# Patient Record
Sex: Female | Born: 1995 | Race: Black or African American | Hispanic: No | Marital: Single | Smoking: Former smoker
Health system: Southern US, Community
[De-identification: ages and names within clinical notes are randomized; demographics above are authoritative.]

## PROBLEM LIST (undated history)

## (undated) DIAGNOSIS — J45909 Unspecified asthma, uncomplicated: Secondary | ICD-10-CM

---

## 2014-03-18 ENCOUNTER — Emergency Department (HOSPITAL_COMMUNITY)
Admission: EM | Admit: 2014-03-18 | Discharge: 2014-03-18 | Disposition: A | Discharge status: Home / Self Care | Payer: Medicaid Other | Attending: Emergency Medicine | Admitting: Emergency Medicine

## 2014-03-18 ENCOUNTER — Encounter (HOSPITAL_COMMUNITY): Payer: Self-pay | Admitting: Neurology

## 2014-03-18 DIAGNOSIS — Z720 Tobacco use: Secondary | ICD-10-CM | POA: Diagnosis not present

## 2014-03-18 DIAGNOSIS — R112 Nausea with vomiting, unspecified: Secondary | ICD-10-CM

## 2014-03-18 DIAGNOSIS — Z3202 Encounter for pregnancy test, result negative: Secondary | ICD-10-CM | POA: Insufficient documentation

## 2014-03-18 DIAGNOSIS — R197 Diarrhea, unspecified: Secondary | ICD-10-CM | POA: Insufficient documentation

## 2014-03-18 LAB — POC URINE PREG, ED: Preg Test, Ur: NEGATIVE

## 2014-03-18 LAB — URINALYSIS, ROUTINE W REFLEX MICROSCOPIC
Bilirubin Urine: NEGATIVE
Glucose, UA: NEGATIVE mg/dL
LEUKOCYTES UA: NEGATIVE
NITRITE: NEGATIVE
PROTEIN: 30 mg/dL — AB
SPECIFIC GRAVITY, URINE: 1.035 — AB (ref 1.005–1.030)
UROBILINOGEN UA: 0.2 mg/dL (ref 0.0–1.0)
pH: 6 (ref 5.0–8.0)

## 2014-03-18 LAB — COMPREHENSIVE METABOLIC PANEL
ALBUMIN: 5.1 g/dL (ref 3.5–5.2)
ALK PHOS: 76 U/L (ref 39–117)
ALT: 12 U/L (ref 0–35)
AST: 22 U/L (ref 0–37)
Anion gap: 13 (ref 5–15)
BUN: 13 mg/dL (ref 6–23)
CALCIUM: 10.1 mg/dL (ref 8.4–10.5)
CHLORIDE: 105 meq/L (ref 96–112)
CO2: 19 mmol/L (ref 19–32)
Creatinine, Ser: 0.83 mg/dL (ref 0.50–1.10)
GFR calc Af Amer: >90 mL/min (ref >90)
Glucose, Bld: 143 mg/dL — ABNORMAL HIGH (ref 70–99)
Potassium: 5.1 mmol/L (ref 3.5–5.1)
SODIUM: 137 mmol/L (ref 135–145)
Total Bilirubin: 1.3 mg/dL — ABNORMAL HIGH (ref 0.3–1.2)
Total Protein: 8.6 g/dL — ABNORMAL HIGH (ref 6.0–8.3)

## 2014-03-18 LAB — URINE MICROSCOPIC-ADD ON

## 2014-03-18 LAB — CBC WITH DIFFERENTIAL/PLATELET
BASOS ABS: 0 10*3/uL (ref 0.0–0.1)
BASOS PCT: 0 % (ref 0–1)
EOS PCT: 0 % (ref 0–5)
Eosinophils Absolute: 0 10*3/uL (ref 0.0–0.7)
HCT: 50.7 % — ABNORMAL HIGH (ref 36.0–46.0)
HEMOGLOBIN: 17.5 g/dL — AB (ref 12.0–15.0)
Lymphocytes Relative: 3 % — ABNORMAL LOW (ref 12–46)
Lymphs Abs: 0.6 10*3/uL — ABNORMAL LOW (ref 0.7–4.0)
MCH: 28.5 pg (ref 26.0–34.0)
MCHC: 34.5 g/dL (ref 30.0–36.0)
MCV: 82.4 fL (ref 78.0–100.0)
Monocytes Absolute: 0.6 10*3/uL (ref 0.1–1.0)
Monocytes Relative: 3 % (ref 3–12)
NEUTROS ABS: 19.1 10*3/uL — AB (ref 1.7–7.7)
NEUTROS PCT: 94 % — AB (ref 43–77)
PLATELETS: 348 10*3/uL (ref 150–400)
RBC: 6.15 MIL/uL — AB (ref 3.87–5.11)
RDW: 13 % (ref 11.5–15.5)
WBC: 20.4 10*3/uL — AB (ref 4.0–10.5)

## 2014-03-18 LAB — LIPASE, BLOOD: LIPASE: 28 U/L (ref 11–59)

## 2014-03-18 MED ORDER — ONDANSETRON 8 MG PO TBDP: 8.0000 mg | ORAL_TABLET | Freq: Three times a day (TID) | ORAL | Status: DC | PRN

## 2014-03-18 MED ORDER — KETOROLAC TROMETHAMINE 30 MG/ML IJ SOLN
30.0000 mg | Freq: Once | INTRAMUSCULAR | Status: AC
  Administered 2014-03-18: 30 mg via INTRAVENOUS
  Filled 2014-03-18: qty 1

## 2014-03-18 MED ORDER — ONDANSETRON HCL 4 MG/2ML IJ SOLN
4.0000 mg | Freq: Once | INTRAMUSCULAR | Status: AC
  Administered 2014-03-18: 4 mg via INTRAVENOUS
  Filled 2014-03-18: qty 2

## 2014-03-18 MED ORDER — SODIUM CHLORIDE 0.9 % IV BOLUS (SEPSIS)
1500.0000 mL | Freq: Once | INTRAVENOUS | Status: AC
  Administered 2014-03-18: 1000 mL via INTRAVENOUS

## 2014-03-18 MED ORDER — MORPHINE SULFATE 4 MG/ML IJ SOLN
4.0000 mg | Freq: Once | INTRAMUSCULAR | Status: AC
  Administered 2014-03-18: 4 mg via INTRAVENOUS
  Filled 2014-03-18: qty 1

## 2014-03-18 NOTE — ED Notes (Signed)
Pt was able to ambulate to the restroom with no assistance needed. Pt was not able to provide urine specimen.

## 2014-03-18 NOTE — ED Notes (Signed)
Pt placed into gown and on monitor upon arrival to room. Pt monitored by blood pressure and pulse ox. Pt asked to provide urine specimen pt stated she could not provide one at this time. Pts family remains at bedside.

## 2014-03-18 NOTE — ED Notes (Signed)
Pt remains monitored by blood pressure and pulse ox. pts family remains at bedside.  

## 2014-03-18 NOTE — Discharge Instructions (Signed)

## 2014-03-18 NOTE — ED Provider Notes (Signed)
CSN: [id number]     Arrival date & time 03/18/14  1144 History   First MD Initiated Contact with Patient 03/18/14 1204     Chief Complaint  Patient presents with  . Emesis      The history is provided by the patient.   patient reports nausea vomiting and diarrhea since 5 AM.  She's had chills without documented fever.  She reports some sharp and crampy abdominal pain.  She denies blood in the stool or blood in her vomit.  She denies black stools.  No prior history of similar symptoms.  No history of gallstones.  No recent sick contacts.  Denies fever.  Symptoms are mild to moderate.  She reports the cramping is moderate in severity.  No urinary complaints.  No vaginal complaints.  No abnormal vaginal bleeding.  Denies chest pain or shortness of breath.  No flank pain.    History reviewed. No pertinent past medical history. History reviewed. No pertinent past surgical history. No family history on file. History  Substance Use Topics  . Smoking status: Current Every Day Smoker  . Smokeless tobacco: Not on file  . Alcohol Use: No   OB History    No data available     Review of Systems  All other systems reviewed and are negative.     Allergies  Review of patient's allergies indicates no known allergies.  Home Medications   Prior to Admission medications   Not on File   BP 115/91 mmHg  Pulse 96  Temp(Src) 97.8 F (36.6 C) (Oral)  Resp 18  SpO2 99%  LMP 03/18/2014 Physical Exam  Constitutional: She is oriented to person, place, and time. She appears well-developed and well-nourished. No distress.  HENT:  Head: Normocephalic and atraumatic.  Eyes: EOM are normal.  Neck: Normal range of motion.  Cardiovascular: Normal rate, regular rhythm and normal heart sounds.   Pulmonary/Chest: Effort normal and breath sounds normal.  Abdominal: Soft. She exhibits no distension. There is no tenderness.  Musculoskeletal: Normal range of motion.  Neurological: She is alert and  oriented to person, place, and time.  Skin: Skin is warm and dry.  Psychiatric: She has a normal mood and affect. Judgment normal.  Nursing note and vitals reviewed.   ED Course  Procedures (including critical care time) Labs Review Labs Reviewed  CBC WITH DIFFERENTIAL - Abnormal; Notable for the following:    WBC 20.4 (*)    RBC 6.15 (*)    Hemoglobin 17.5 (*)    HCT 50.7 (*)    Neutrophils Relative % 94 (*)    Neutro Abs 19.1 (*)    Lymphocytes Relative 3 (*)    Lymphs Abs 0.6 (*)    All other components within normal limits  COMPREHENSIVE METABOLIC PANEL - Abnormal; Notable for the following:    Glucose, Bld 143 (*)    Total Protein 8.6 (*)    Total Bilirubin 1.3 (*)    All other components within normal limits  LIPASE, BLOOD  URINALYSIS, ROUTINE W REFLEX MICROSCOPIC  PREGNANCY, URINE    Imaging Review No results found.   EKG Interpretation None      MDM   Final diagnoses:  Nausea vomiting and diarrhea    Patient feels much better after IV fluids and antinausea medicine.  Small dose of narcotics as well as Toradol to help with her crampy abdominal pain.  Heart rate improved from 114 down to 73.  She's feeling much better would like to go  home at this time.  White blood cell count is noted but is very nonspecific.  Repeat abdominal exam is benign without focal tenderness.    Lyanne CoKevin M Emmery Seiler, MD 03/19/14 [phone]
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
## 2014-07-14 ENCOUNTER — Emergency Department (HOSPITAL_BASED_OUTPATIENT_CLINIC_OR_DEPARTMENT_OTHER)
Admission: EM | Admit: 2014-07-14 | Discharge: 2014-07-14 | Disposition: A | Discharge status: Home / Self Care | Payer: No Typology Code available for payment source | Attending: Emergency Medicine | Admitting: Emergency Medicine

## 2014-07-14 ENCOUNTER — Emergency Department (HOSPITAL_BASED_OUTPATIENT_CLINIC_OR_DEPARTMENT_OTHER): Payer: No Typology Code available for payment source

## 2014-07-14 ENCOUNTER — Encounter (HOSPITAL_BASED_OUTPATIENT_CLINIC_OR_DEPARTMENT_OTHER): Payer: Self-pay

## 2014-07-14 DIAGNOSIS — S6991XA Unspecified injury of right wrist, hand and finger(s), initial encounter: Secondary | ICD-10-CM | POA: Diagnosis not present

## 2014-07-14 DIAGNOSIS — Z720 Tobacco use: Secondary | ICD-10-CM | POA: Diagnosis not present

## 2014-07-14 DIAGNOSIS — Y9389 Activity, other specified: Secondary | ICD-10-CM | POA: Diagnosis not present

## 2014-07-14 DIAGNOSIS — M25511 Pain in right shoulder: Secondary | ICD-10-CM

## 2014-07-14 DIAGNOSIS — S0081XA Abrasion of other part of head, initial encounter: Secondary | ICD-10-CM | POA: Insufficient documentation

## 2014-07-14 DIAGNOSIS — S299XXA Unspecified injury of thorax, initial encounter: Secondary | ICD-10-CM | POA: Diagnosis not present

## 2014-07-14 DIAGNOSIS — Y92410 Unspecified street and highway as the place of occurrence of the external cause: Secondary | ICD-10-CM | POA: Insufficient documentation

## 2014-07-14 DIAGNOSIS — M546 Pain in thoracic spine: Secondary | ICD-10-CM

## 2014-07-14 DIAGNOSIS — S161XXA Strain of muscle, fascia and tendon at neck level, initial encounter: Secondary | ICD-10-CM | POA: Insufficient documentation

## 2014-07-14 DIAGNOSIS — Y998 Other external cause status: Secondary | ICD-10-CM | POA: Insufficient documentation

## 2014-07-14 DIAGNOSIS — M79641 Pain in right hand: Secondary | ICD-10-CM

## 2014-07-14 DIAGNOSIS — S199XXA Unspecified injury of neck, initial encounter: Secondary | ICD-10-CM | POA: Diagnosis present

## 2014-07-14 DIAGNOSIS — S4991XA Unspecified injury of right shoulder and upper arm, initial encounter: Secondary | ICD-10-CM | POA: Diagnosis not present

## 2014-07-14 MED ORDER — HYDROCODONE-ACETAMINOPHEN 5-325 MG PO TABS
1.0000 | ORAL_TABLET | Freq: Once | ORAL | Status: DC
Start: 2014-07-14 — End: 2014-07-14

## 2014-07-14 MED ORDER — HYDROCODONE-ACETAMINOPHEN 5-325 MG PO TABS
1.0000 | ORAL_TABLET | Freq: Once | ORAL | Status: AC
  Administered 2014-07-14: 1 via ORAL
  Filled 2014-07-14: qty 1

## 2014-07-14 MED ORDER — HYDROCODONE-ACETAMINOPHEN 5-325 MG PO TABS: 1.0000 | ORAL_TABLET | ORAL | Status: DC | PRN

## 2014-07-14 MED ORDER — CYCLOBENZAPRINE HCL 5 MG PO TABS: 5.0000 mg | ORAL_TABLET | Freq: Two times a day (BID) | ORAL | Status: DC | PRN

## 2014-07-14 NOTE — ED Notes (Signed)
Patient transported to X-ray 
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
## 2014-07-14 NOTE — Discharge Instructions (Signed)
Cervical Sprain °A cervical sprain is when the tissues (ligaments) that hold the neck bones in place stretch or tear. °HOME CARE  °· Put ice on the injured area. °¨ Put ice in a plastic bag. °¨ Place a towel between your skin and the bag. °¨ Leave the ice on for 15-20 minutes, 3-4 times a day. °· You may have been given a collar to wear. This collar keeps your neck from moving while you heal. °¨ Do not take the collar off unless told by your doctor. °¨ If you have long hair, keep it outside of the collar. °¨ Ask your doctor before changing the position of your collar. You may need to change its position over time to make it more comfortable. °¨ If you are allowed to take off the collar for cleaning or bathing, follow your doctor's instructions on how to do it safely. °¨ Keep your collar clean by wiping it with mild soap and water. Dry it completely. If the collar has removable pads, remove them every 1-2 days to hand wash them with soap and water. Allow them to air dry. They should be dry before you wear them in the collar. °¨ Do not drive while wearing the collar. °· Only take medicine as told by your doctor. °· Keep all doctor visits as told. °· Keep all physical therapy visits as told. °· Adjust your work station so that you have good posture while you work. °· Avoid positions and activities that make your problems worse. °· Warm up and stretch before being active. °GET HELP IF: °· Your pain is not controlled with medicine. °· You cannot take less pain medicine over time as planned. °· Your activity level does not improve as expected. °GET HELP RIGHT AWAY IF:  °· You are bleeding. °· Your stomach is upset. °· You have an allergic reaction to your medicine. °· You develop new problems that you cannot explain. °· You lose feeling (become numb) or you cannot move any part of your body (paralysis). °· You have tingling or weakness in any part of your body. °· Your symptoms get worse. Symptoms include: °· Pain,  soreness, stiffness, puffiness (swelling), or a burning feeling in your neck. °· Pain when your neck is touched. °· Shoulder or upper back pain. °· Limited ability to move your neck. °· Headache. °· Dizziness. °· Your hands or arms feel week, lose feeling, or tingle. °· Muscle spasms. °· Difficulty swallowing or chewing. °MAKE SURE YOU:  °· Understand these instructions. °· Will watch your condition. °· Will get help right away if you are not doing well or get worse. °Document Released: 08/01/2007 Document Revised: 10/15/2012 Document Reviewed: 08/20/2012 °ExitCare® Patient Information ©2015 [employer]. This information is not intended to replace advice given to you by your health care provider. Make sure you discuss any questions you have with your health care provider. ° °Motor Vehicle Collision °It is common to have multiple bruises and sore muscles after a motor vehicle collision (MVC). These tend to feel worse for the first 24 hours. You may have the most stiffness and soreness over the first several hours. You may also feel worse when you wake up the first morning after your collision. After this point, you will usually begin to improve with each day. The speed of improvement often depends on the severity of the collision, the number of injuries, and the location and nature of these injuries. °HOME CARE INSTRUCTIONS °· Put ice on the injured area. °¨   Put ice in a plastic bag. °¨ Place a towel between your skin and the bag. °¨ Leave the ice on for 15-20 minutes, 3-4 times a day, or as directed by your health care provider. °· Drink enough fluids to keep your urine clear or pale yellow. Do not drink alcohol. °· Take a warm shower or bath once or twice a day. This will increase blood flow to sore muscles. °· You may return to activities as directed by your caregiver. Be careful when lifting, as this may aggravate neck or back pain. °· Only take over-the-counter or prescription medicines for pain, discomfort,  or fever as directed by your caregiver. Do not use aspirin. This may increase bruising and bleeding. °SEEK IMMEDIATE MEDICAL CARE IF: °· You have numbness, tingling, or weakness in the arms or legs. °· You develop severe headaches not relieved with medicine. °· You have severe neck pain, especially tenderness in the middle of the back of your neck. °· You have changes in bowel or bladder control. °· There is increasing pain in any area of the body. °· You have shortness of breath, light-headedness, dizziness, or fainting. °· You have chest pain. °· You feel sick to your stomach (nauseous), throw up (vomit), or sweat. °· You have increasing abdominal discomfort. °· There is blood in your urine, stool, or vomit. °· You have pain in your shoulder (shoulder strap areas). °· You feel your symptoms are getting worse. °MAKE SURE YOU: °· Understand these instructions. °· Will watch your condition. °· Will get help right away if you are not doing well or get worse. °Document Released: 02/12/2005 Document Revised: 06/29/2013 Document Reviewed: 07/12/2010 °ExitCare® Patient Information ©2015 [employer]. This information is not intended to replace advice given to you by your health care provider. Make sure you discuss any questions you have with your health care provider. ° °

## 2015-11-03 ENCOUNTER — Emergency Department (HOSPITAL_BASED_OUTPATIENT_CLINIC_OR_DEPARTMENT_OTHER): Payer: Self-pay

## 2015-11-03 ENCOUNTER — Encounter (HOSPITAL_BASED_OUTPATIENT_CLINIC_OR_DEPARTMENT_OTHER): Payer: Self-pay | Admitting: Emergency Medicine

## 2015-11-03 ENCOUNTER — Emergency Department (HOSPITAL_BASED_OUTPATIENT_CLINIC_OR_DEPARTMENT_OTHER)
Admission: EM | Admit: 2015-11-03 | Discharge: 2015-11-03 | Disposition: A | Discharge status: Home / Self Care | Payer: Self-pay | Attending: Emergency Medicine | Admitting: Emergency Medicine

## 2015-11-03 DIAGNOSIS — R079 Chest pain, unspecified: Secondary | ICD-10-CM

## 2015-11-03 DIAGNOSIS — F17200 Nicotine dependence, unspecified, uncomplicated: Secondary | ICD-10-CM | POA: Insufficient documentation

## 2015-11-03 DIAGNOSIS — R05 Cough: Secondary | ICD-10-CM | POA: Insufficient documentation

## 2015-11-03 LAB — CBC
HCT: 46.1 % — ABNORMAL HIGH (ref 36.0–46.0)
Hemoglobin: 15.4 g/dL — ABNORMAL HIGH (ref 12.0–15.0)
MCH: 27.7 pg (ref 26.0–34.0)
MCHC: 33.4 g/dL (ref 30.0–36.0)
MCV: 83.1 fL (ref 78.0–100.0)
PLATELETS: 355 10*3/uL (ref 150–400)
RBC: 5.55 MIL/uL — ABNORMAL HIGH (ref 3.87–5.11)
RDW: 13.6 % (ref 11.5–15.5)
WBC: 10.5 10*3/uL (ref 4.0–10.5)

## 2015-11-03 LAB — BASIC METABOLIC PANEL
Anion gap: 7 (ref 5–15)
BUN: 19 mg/dL (ref 6–20)
CALCIUM: 9.2 mg/dL (ref 8.9–10.3)
CO2: 23 mmol/L (ref 22–32)
CREATININE: 0.66 mg/dL (ref 0.44–1.00)
Chloride: 105 mmol/L (ref 101–111)
GFR calc Af Amer: >60 mL/min (ref >60)
Glucose, Bld: 107 mg/dL — ABNORMAL HIGH (ref 65–99)
Potassium: 4.2 mmol/L (ref 3.5–5.1)
SODIUM: 135 mmol/L (ref 135–145)

## 2015-11-03 LAB — D-DIMER, QUANTITATIVE: D-Dimer, Quant: 0.42 ug/mL-FEU (ref 0.00–0.50)

## 2015-11-03 LAB — TROPONIN I

## 2015-11-03 MED ORDER — KETOROLAC TROMETHAMINE 30 MG/ML IJ SOLN
30.0000 mg | Freq: Once | INTRAMUSCULAR | Status: AC
  Administered 2015-11-03: 30 mg via INTRAVENOUS
  Filled 2015-11-03: qty 1

## 2015-11-03 MED ORDER — NAPROXEN 500 MG PO TABS: 500.0000 mg | ORAL_TABLET | Freq: Two times a day (BID) | ORAL | 0 refills | Status: DC

## 2015-11-03 MED FILL — NAPROXEN 500 MG TABLET: 500 | 7 days supply | Qty: 14 | Fill #0
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
## 2015-11-03 NOTE — ED Notes (Signed)
Patient transported to X-ray 

## 2015-11-03 NOTE — ED Notes (Signed)
PA at bedside.

## 2015-11-03 NOTE — ED Triage Notes (Signed)
Pt reports R side chest pain since Sunday. Sudden onset while walking. Pain is reproducible to touch and increases with movement and cough. Pt states she has been sick with a cough and has been coughing up brown sputum.

## 2015-11-03 NOTE — Discharge Instructions (Signed)
Medications: Naprosyn  Treatment: Take Naprosyn twice daily as prescribed. Continue taking your over-the-counter cold medicines. Do not take other NSAIDs medications such as ibuprofen or Aleve, as these will interact with Naprosyn.  Follow-up: Please follow-up with your primary care provider next week if you are continuing to have symptoms. Please return to the emergency department if you develop any new or worsening symptoms.

## 2015-11-03 NOTE — ED Provider Notes (Signed)
[facility]   CSN: [id number] Arrival date & time: 11/03/15  1005     History   Chief Complaint Chief Complaint  Patient presents with  . Chest Pain    HPI Tanya Benson is a 20 y.o. female who is previously healthy who presents with a 5 day history of right-sided chest pain. Patient states she woke up with the pain. Patient previously had a cold with a cough prior to the pain onset. Patient states she has been coughing up mucus, but no blood. Patient describes her pain as a constant pressure with sharp pains with deep inspiration, movement, coughing. Patient has had associated shortness of breath at rest and on exertion. The pain has been keeping her up at night and sometimes feels like she cannot get up from a lying position due to the pain. Patient denies any recent long trips, recent surgeries or cancer, new leg pain or swelling. Patient does have Implant. Patient denies smoking. She denies any abdominal pain, nausea, vomiting, urinary symptoms.  HPI  History reviewed. No pertinent past medical history.  There are no active problems to display for this patient.   History reviewed. No pertinent surgical history.  OB History    No data available       Home Medications    Prior to Admission medications   Medication Sig Start Date End Date Taking? Authorizing Provider  cyclobenzaprine (FLEXERIL) 5 MG tablet Take 1 tablet (5 mg total) by mouth 2 (two) times daily as needed. 07/14/14   Teressa Lower, NP  HYDROcodone-acetaminophen (NORCO/VICODIN) 5-325 MG per tablet Take 1-2 tablets by mouth every 4 (four) hours as needed. 07/14/14   Teressa Lower, NP  naproxen (NAPROSYN) 500 MG tablet Take 1 tablet (500 mg total) by mouth 2 (two) times daily with a meal. 11/03/15   Waylan Boga Tyquan Carmickle, PA-C  ondansetron (ZOFRAN ODT) 8 MG disintegrating tablet Take 1 tablet (8 mg total) by mouth every 8 (eight) hours as needed for nausea or vomiting. 03/18/14    Azalia Bilis, MD    Family History No family history on file.  Social History Social History  Substance Use Topics  . Smoking status: Current Every Day Smoker  . Smokeless tobacco: Never Used  . Alcohol use No     Allergies   Review of patient's allergies indicates no known allergies.   Review of Systems Review of Systems  Constitutional: Negative for chills and fever.  HENT: Negative for facial swelling and sore throat.   Respiratory: Positive for cough and shortness of breath.   Cardiovascular: Positive for chest pain.  Gastrointestinal: Negative for abdominal pain, nausea and vomiting.  Genitourinary: Negative for dysuria.  Musculoskeletal: Negative for back pain.  Skin: Negative for rash and wound.  Neurological: Negative for headaches.  Psychiatric/Behavioral: The patient is not nervous/anxious.      Physical Exam Updated Vital Signs BP 131/89 (BP Location: Left Arm)   Pulse (!) 57   Temp 97.8 F (36.6 C) (Oral)   Resp 23   Ht 5\' 4"  (1.626 m)   Wt 68 kg   LMP 08/03/2015 (Approximate) Comment: nexplanon in arm  SpO2 100%   BMI 25.75 kg/m   Physical Exam  Constitutional: She appears well-developed and well-nourished. No distress.  HENT:  Head: Normocephalic and atraumatic.  Mouth/Throat: Oropharynx is clear and moist. No oropharyngeal exudate.  Eyes: Conjunctivae are normal. Pupils are equal, round, and reactive to light. Right eye exhibits no discharge. Left eye exhibits no  discharge. No scleral icterus.  Neck: Normal range of motion. Neck supple. No thyromegaly present.  Cardiovascular: Normal rate, regular rhythm, normal heart sounds and intact distal pulses.  Exam reveals no gallop and no friction rub.   No murmur heard. Pulmonary/Chest: Effort normal and breath sounds normal. No stridor. No respiratory distress. She has no wheezes. She has no rales. She exhibits tenderness.    Abdominal: Soft. Bowel sounds are normal. She exhibits no distension.  There is no tenderness. There is no rebound and no guarding.  Musculoskeletal: She exhibits no edema.  No calf TTP bilaterally  Lymphadenopathy:    She has no cervical adenopathy.  Neurological: She is alert. Coordination normal.  Skin: Skin is warm and dry. No rash noted. She is not diaphoretic. No pallor.  Psychiatric: She has a normal mood and affect.  Nursing note and vitals reviewed.    ED Treatments / Results  Labs (all labs ordered are listed, but only abnormal results are displayed) Labs Reviewed  BASIC METABOLIC PANEL - Abnormal; Notable for the following:       Result Value   Glucose, Bld 107 (*)    All other components within normal limits  CBC - Abnormal; Notable for the following:    RBC 5.55 (*)    Hemoglobin 15.4 (*)    HCT 46.1 (*)    All other components within normal limits  TROPONIN I  D-DIMER, QUANTITATIVE (NOT AT [facility])    EKG  EKG Interpretation  Date/Time:  Thursday November 03 2015 10:15:15 EDT Ventricular Rate:  65 PR Interval:    QRS Duration: 85 QT Interval:  400 QTC Calculation: 416 R Axis:   73 Text Interpretation:  Sinus rhythm Low voltage, precordial leads Normal ECG Confirmed by DELO  MD, DOUGLAS ([id number]) on 11/03/2015 10:22:35 AM       Radiology Dg Chest 2 View  Result Date: 11/03/2015 CLINICAL DATA:  Chest pain, shortness of breath. EXAM: CHEST  2 VIEW COMPARISON:  Radiographs of Jul 14, 2014. FINDINGS: The heart size and mediastinal contours are within normal limits. Both lungs are clear. No pneumothorax or pleural effusion is noted. The visualized skeletal structures are unremarkable. IMPRESSION: No active cardiopulmonary disease. Electronically Signed   By: Lupita Raider, M.D.   On: 11/03/2015 11:02    Procedures Procedures (including critical care time)  Medications Ordered in ED Medications  ketorolac (TORADOL) 30 MG/ML injection 30 mg (30 mg Intravenous Given 11/03/15 1132)     Initial Impression / Assessment and Plan / ED  Course  I have reviewed the triage vital signs and the nursing notes.  Pertinent labs & imaging results that were available during my care of the patient were reviewed by me and considered in my medical decision making (see chart for details).  Clinical Course    Previously healthy 20 year old female. Suspect pleuritis from underlying viral infection. CBC shows hemoglobin 15.4. BMP shows glucose 107. D-dimer 0.42. Troponin <0.03. EKG shows NSR. CXR shows no active cardiopulmonary disease. Will discharge patient home with Naprosyn. Follow-up to PCP as needed. Strict return precautions. Patient understands and agrees with plan. I discussed patient case with Dr. Judd Lien who agrees with plan. Patient vitals stable throughout ED course and discharged in satisfactory condition.  Final Clinical Impressions(s) / ED Diagnoses   Final diagnoses:  Nonspecific chest pain    New Prescriptions New Prescriptions   NAPROXEN (NAPROSYN) 500 MG TABLET    Take 1 tablet (500 mg total) by mouth 2 (two) times  daily with a meal.     Emi Holeslexandra M Micca Matura, PA-C 11/03/15 1145    Geoffery Lyonsouglas Delo, MD 11/04/15 [phone]

## 2019-02-14 ENCOUNTER — Emergency Department (HOSPITAL_BASED_OUTPATIENT_CLINIC_OR_DEPARTMENT_OTHER): Payer: BLUE CROSS/BLUE SHIELD

## 2019-02-14 ENCOUNTER — Encounter (HOSPITAL_BASED_OUTPATIENT_CLINIC_OR_DEPARTMENT_OTHER): Payer: Self-pay | Admitting: Emergency Medicine

## 2019-02-14 ENCOUNTER — Other Ambulatory Visit: Payer: Self-pay

## 2019-02-14 ENCOUNTER — Emergency Department (HOSPITAL_BASED_OUTPATIENT_CLINIC_OR_DEPARTMENT_OTHER)
Admission: EM | Admit: 2019-02-14 | Discharge: 2019-02-14 | Disposition: A | Discharge status: Home / Self Care | Payer: BLUE CROSS/BLUE SHIELD | Attending: Emergency Medicine | Admitting: Emergency Medicine

## 2019-02-14 DIAGNOSIS — Y9389 Activity, other specified: Secondary | ICD-10-CM | POA: Insufficient documentation

## 2019-02-14 DIAGNOSIS — S299XXA Unspecified injury of thorax, initial encounter: Secondary | ICD-10-CM | POA: Diagnosis present

## 2019-02-14 DIAGNOSIS — Y999 Unspecified external cause status: Secondary | ICD-10-CM | POA: Diagnosis not present

## 2019-02-14 DIAGNOSIS — M25532 Pain in left wrist: Secondary | ICD-10-CM | POA: Diagnosis not present

## 2019-02-14 DIAGNOSIS — S2232XA Fracture of one rib, left side, initial encounter for closed fracture: Secondary | ICD-10-CM | POA: Diagnosis not present

## 2019-02-14 DIAGNOSIS — J45909 Unspecified asthma, uncomplicated: Secondary | ICD-10-CM | POA: Diagnosis not present

## 2019-02-14 DIAGNOSIS — M25561 Pain in right knee: Secondary | ICD-10-CM | POA: Diagnosis not present

## 2019-02-14 DIAGNOSIS — Y92410 Unspecified street and highway as the place of occurrence of the external cause: Secondary | ICD-10-CM | POA: Diagnosis not present

## 2019-02-14 HISTORY — DX: Unspecified asthma, uncomplicated: J45.909

## 2019-02-14 MED ORDER — HYDROCODONE-ACETAMINOPHEN 5-325 MG PO TABS: 1.0000 | ORAL_TABLET | Freq: Four times a day (QID) | ORAL | 0 refills | Status: AC | PRN

## 2019-02-14 MED ORDER — HYDROCODONE-ACETAMINOPHEN 5-325 MG PO TABS
1.0000 | ORAL_TABLET | Freq: Once | ORAL | Status: AC
  Administered 2019-02-14: 1 via ORAL
  Filled 2019-02-14: qty 1

## 2019-02-14 MED ORDER — CYCLOBENZAPRINE HCL 10 MG PO TABS: 10.0000 mg | ORAL_TABLET | Freq: Two times a day (BID) | ORAL | 0 refills | Status: AC | PRN

## 2019-02-14 NOTE — ED Notes (Signed)
Patient transported to X-ray 
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
